# Patient Record
Sex: Male | Born: 1995 | Race: White | Hispanic: No | Marital: Single | Smoking: Never smoker
Health system: Southern US, Community
[De-identification: ages and names within clinical notes are randomized; demographics above are authoritative.]

## PROBLEM LIST (undated history)

## (undated) HISTORY — PX: HAND SURGERY: SHX662

---
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
## 2016-12-28 IMAGING — RF DG TIBIA/FIBULA 2V*R*
1 series · 9 of 9 positions shown · non-contrast
Comparison: 11/16/2014

CLINICAL DATA: ORIF of right tibial fracture.

EXAM:
RIGHT TIBIA AND FIBULA - 2 VIEW; DG C-ARM 61-120 MIN-NO REPORT

[Series 1: run · 9 of 9 slices shown]
[im 1/9]
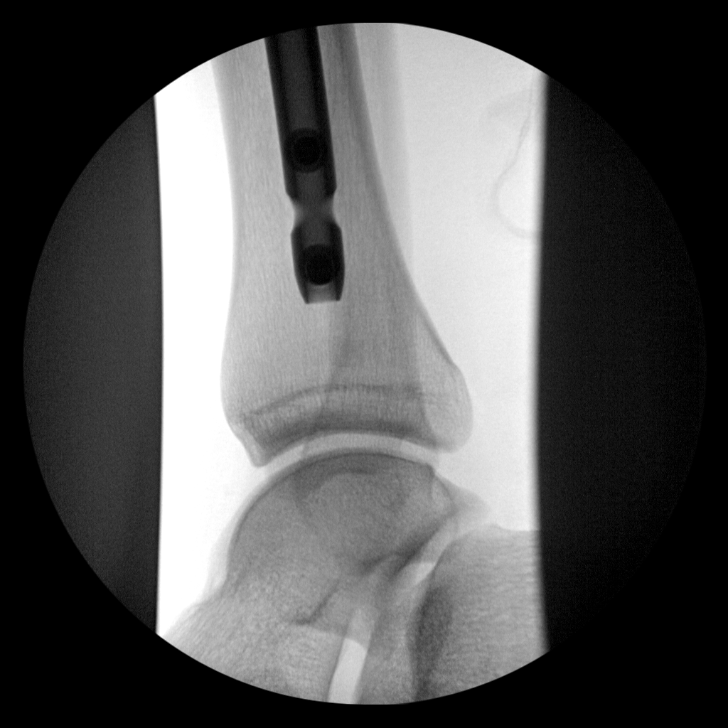
[im 2/9]
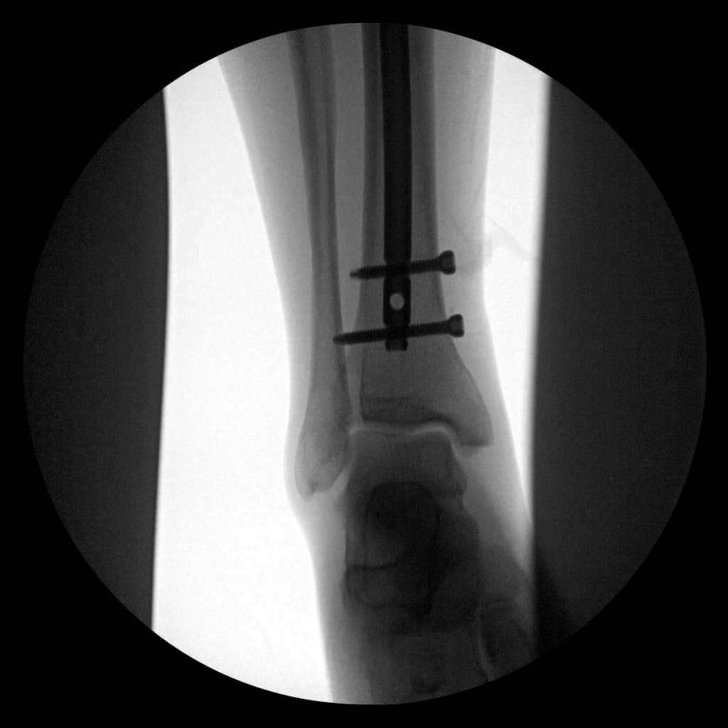
[im 3/9]
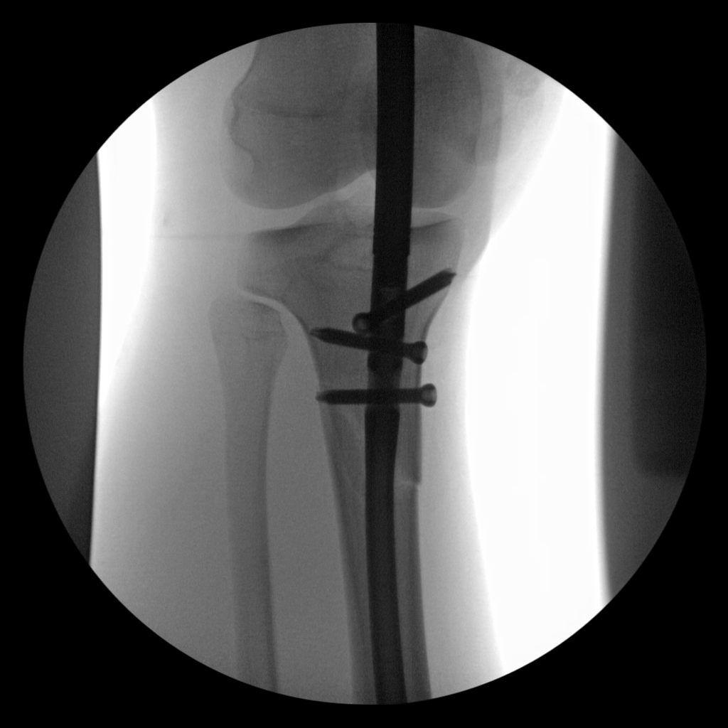
[im 4/9]
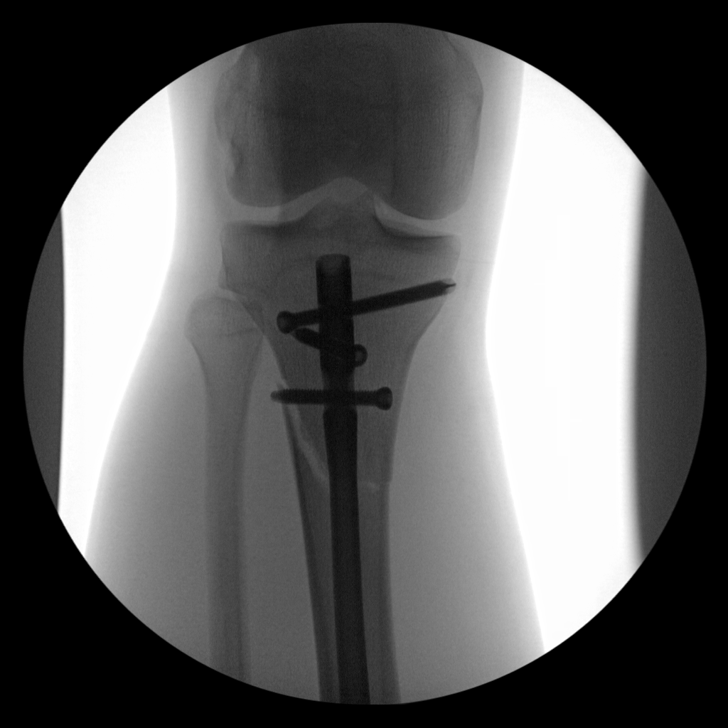
[im 5/9]
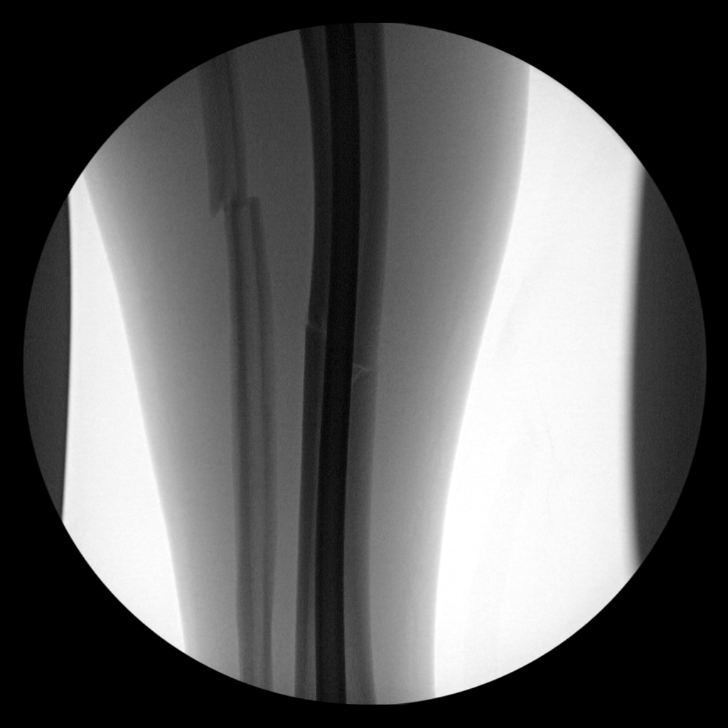
[im 6/9]
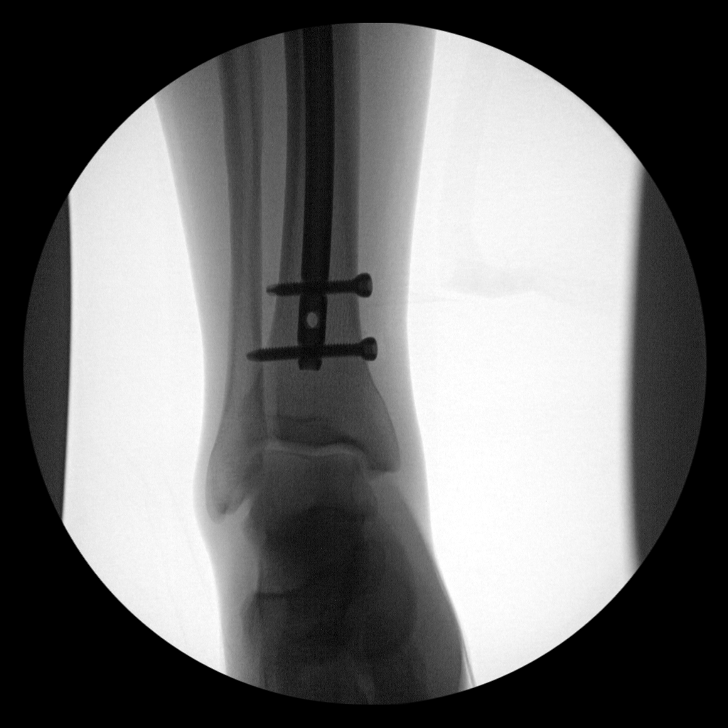
[im 7/9]
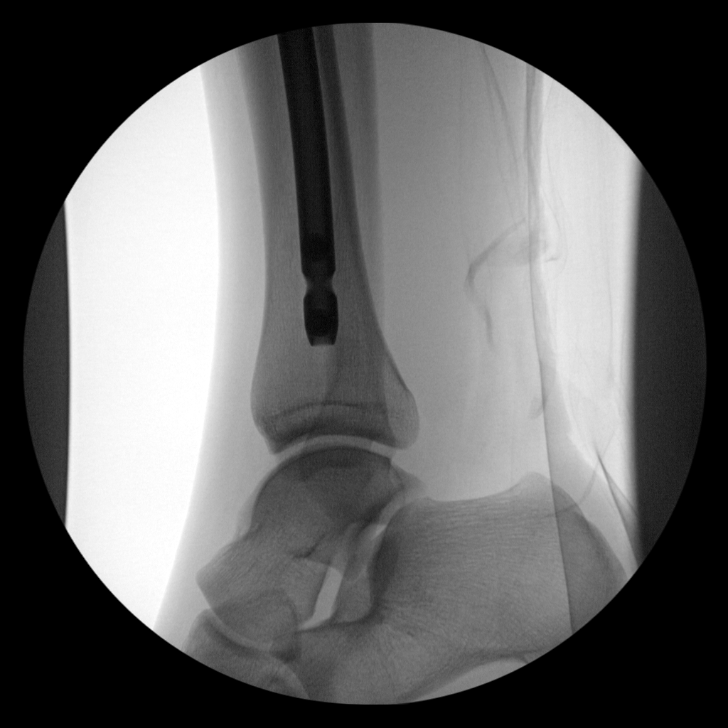
[im 8/9]
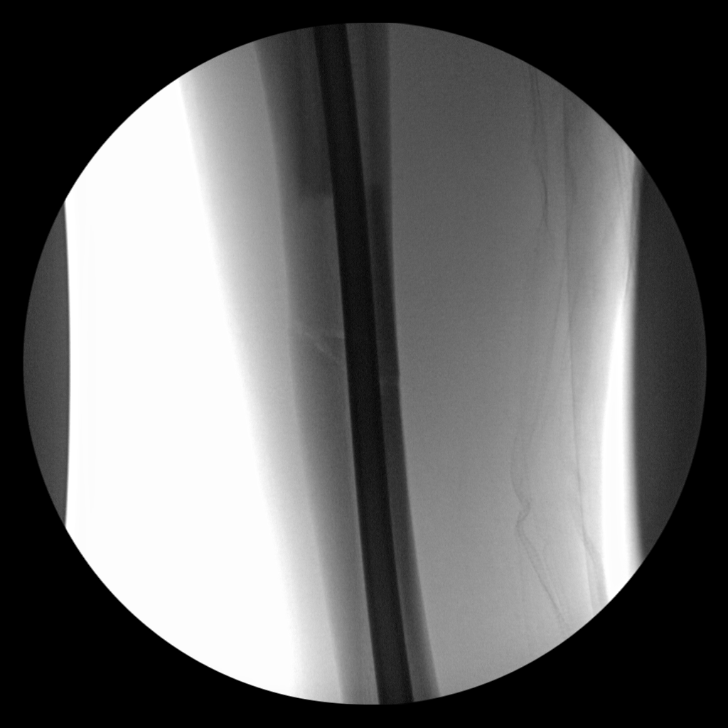
[im 9/9]
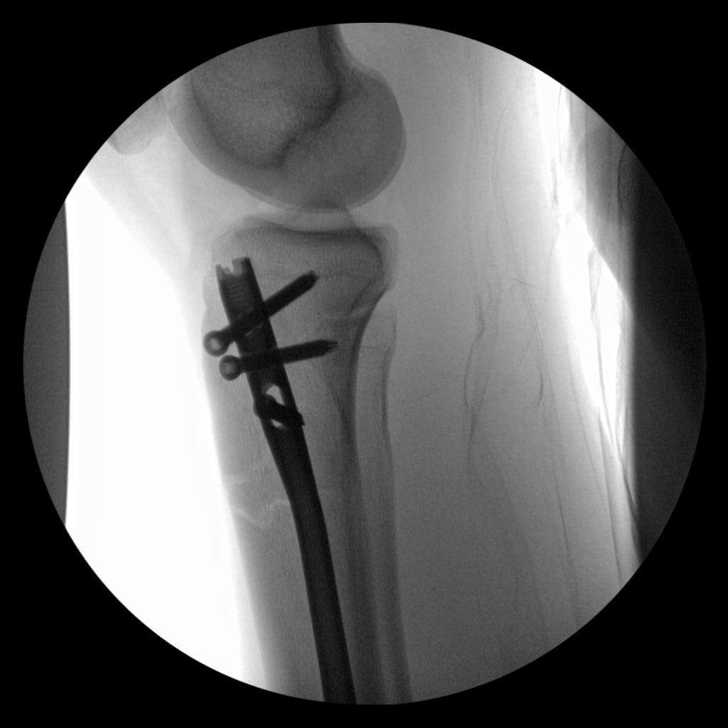

[9 of 9 positions shown; findings below may reference images not displayed]

FINDINGS: A C-arm was utilized intraoperatively during the orthopedic surgical
procedure. This demonstrates placement of an intramedullary nail
traversing the right tibial diaphyseal fracture. Alignment appears
near anatomic. There also is improved alignment at the level of the
mid fibular fracture.
IMPRESSION: Near anatomic alignment at the level of diaphyseal fracture after
placement of intra medullary nail all in the right tibia.

## 2017-10-15 ENCOUNTER — Other Ambulatory Visit: Payer: Self-pay

## 2017-10-15 ENCOUNTER — Encounter (HOSPITAL_BASED_OUTPATIENT_CLINIC_OR_DEPARTMENT_OTHER): Payer: Self-pay | Admitting: Emergency Medicine

## 2017-10-15 ENCOUNTER — Emergency Department (HOSPITAL_BASED_OUTPATIENT_CLINIC_OR_DEPARTMENT_OTHER)
Admission: EM | Admit: 2017-10-15 | Discharge: 2017-10-15 | Disposition: A | Discharge status: Home / Self Care | Payer: BLUE CROSS/BLUE SHIELD | Attending: Emergency Medicine | Admitting: Emergency Medicine

## 2017-10-15 DIAGNOSIS — M545 Low back pain, unspecified: Secondary | ICD-10-CM

## 2017-10-15 DIAGNOSIS — Z7982 Long term (current) use of aspirin: Secondary | ICD-10-CM | POA: Insufficient documentation

## 2017-10-15 DIAGNOSIS — Z79899 Other long term (current) drug therapy: Secondary | ICD-10-CM | POA: Insufficient documentation

## 2017-10-15 LAB — URINALYSIS, ROUTINE W REFLEX MICROSCOPIC
BILIRUBIN URINE: NEGATIVE
Glucose, UA: NEGATIVE mg/dL
Hgb urine dipstick: NEGATIVE
Ketones, ur: NEGATIVE mg/dL
Leukocytes, UA: NEGATIVE
NITRITE: NEGATIVE
PH: 6 (ref 5.0–8.0)
Protein, ur: NEGATIVE mg/dL

## 2017-10-15 MED ORDER — MELOXICAM 7.5 MG PO TABS: 15.0000 mg | ORAL_TABLET | Freq: Every day | ORAL | 0 refills | Status: AC

## 2017-10-15 NOTE — ED Provider Notes (Signed)
[facility] EMERGENCY DEPARTMENT Provider Note   CSN: [id number] Arrival date & time: 10/15/17  1904     History   Chief Complaint Chief Complaint  Patient presents with  . Back Pain    HPI Mark Wall is a 22 y.o. male.  22 year old male who presents with low back pain.  Patient states that he has had at least 4 months of low back pain that is across his lower back.  He has not noticed any specific activities that exacerbate his pain and nothing seems to make it better.  He has not tried any medications.  He does do a lot of heavy lifting for work.  He denies any specific trauma or injury prior to onset of pain.  He denies any numbness or weakness, saddle anesthesia, or bowel/bladder problems.  No fevers or recent illness.  No urinary symptoms.  He denies any drug use.  The history is provided by the patient.  Back Pain      History reviewed. No pertinent past medical history.  Patient Active Problem List   Diagnosis Date Noted  . Compartment syndrome of lower extremity, traumatic, right, initial encounter 11/19/2014  . Tibia fracture 11/17/2014  . Fracture of right tibia 11/17/2014    Past Surgical History:  Procedure Laterality Date  . DEBRIDEMENT AND CLOSURE WOUND N/A 11/22/2014   Procedure: DEBRIDEMENT AND PARTIAL CLOSURE WOUND RIGHT LOWER EXTREMITY,APPLICATION OF WOUND VAC;  Surgeon: [name], MD;  Location: [facility];  Service: Orthopedics;  Laterality: N/A;  . FASCIOTOMY Right 11/19/2014   Procedure: FASCIOTOMY, WOUND VAC PLACEMENT;  Surgeon: [name], MD;  Location: [facility];  Service: Orthopedics;  Laterality: Right;  . HAND SURGERY    . I&D EXTREMITY Right 11/24/2014   Procedure: DEBRIDEMENT LEG WOUND VAC CHANGE VS CLOSURE;  Surgeon: [name], MD;  Location: [facility] OR;  Service: Orthopedics;  Laterality: Right;  . TIBIA IM NAIL INSERTION Right 11/17/2014   Procedure: INTRAMEDULLARY (IM) NAIL TIBIAL;  Surgeon: [name], MD;  Location:  [facility];  Service: Orthopedics;  Laterality: Right;  . VEIN REPAIR Right 11/19/2014   Procedure: PROXIMAL RIGHT TIBIAL VEIN REPAIR;  Surgeon: Larina Earthly, MD;  Location: [facility];  Service: Vascular;  Laterality: Right;        Home Medications    Prior to Admission medications   Medication Sig Start Date End Date Taking? Authorizing Provider  aspirin EC 325 MG tablet Take 1 tablet (325 mg total) by mouth 2 (two) times daily after a meal. 11/19/14   Swinteck, Arlys John, MD  meloxicam (MOBIC) 7.5 MG tablet Take 2 tablets (15 mg total) by mouth daily. 10/15/17   Shekita Boyden, Ambrose Finland, MD  methocarbamol (ROBAXIN) 500 MG tablet Take 1 tablet (500 mg total) by mouth every 6 (six) hours as needed for muscle spasms. 11/25/14   Stilwell, Bryson L, PA-C  ondansetron (ZOFRAN) 4 MG tablet Take 1 tablet (4 mg total) by mouth every 8 (eight) hours as needed for nausea. 11/25/14   Stilwell, Herbie Baltimore, PA-C  oxyCODONE-acetaminophen (PERCOCET/ROXICET) 5-325 MG tablet Take 1-2 tablets by mouth every 4 (four) hours as needed for severe pain. 11/19/14   Swinteck, Arlys John, MD  senna (SENOKOT) 8.6 MG TABS tablet Take 2 tablets (17.2 mg total) by mouth daily as needed for mild constipation. 11/25/14   Stilwell, Bryson L, PA-C  traMADol (ULTRAM) 50 MG tablet Take 1 tablet (50 mg total) by mouth every 6 (six) hours as needed. 11/25/14   Arsenio Loader  L, PA-C    Family History History reviewed. No pertinent family history.  Social History Social History   Tobacco Use  . Smoking status: Never Smoker  Substance Use Topics  . Alcohol use: No  . Drug use: No     Allergies   Patient has no known allergies.   Review of Systems Review of Systems  Musculoskeletal: Positive for back pain.   All other systems reviewed and are negative except that which was mentioned in HPI   Physical Exam Updated Vital Signs BP (!) 117/57 (BP Location: Right Arm)   Pulse (!) 54   Temp 98 F (36.7 C) (Oral)   Resp 16   Ht 5'  10" (1.778 m)   Wt 74.8 kg   SpO2 99%   BMI 23.68 kg/m   Physical Exam  Constitutional: He is oriented to person, place, and time. He appears well-developed and well-nourished. No distress.  HENT:  Head: Normocephalic and atraumatic.  Moist mucous membranes  Eyes: Conjunctivae are normal.  Neck: Neck supple.  Cardiovascular: Normal rate, regular rhythm and normal heart sounds.  No murmur heard. Pulmonary/Chest: Effort normal and breath sounds normal.  Abdominal: Soft. Bowel sounds are normal. He exhibits no distension. There is no tenderness.  Musculoskeletal: He exhibits no edema.  Neurological: He is alert and oriented to person, place, and time. He displays normal reflexes. No sensory deficit. He exhibits normal muscle tone.  Fluent speech 5/5 strength BLE  Skin: Skin is warm and dry.  Psychiatric: He has a normal mood and affect. Judgment normal.  Nursing note and vitals reviewed.    ED Treatments / Results  Labs (all labs ordered are listed, but only abnormal results are displayed) Labs Reviewed  URINALYSIS, ROUTINE W REFLEX MICROSCOPIC - Abnormal; Notable for the following components:      Result Value   Specific Gravity, Urine <1.005 (*)    All other components within normal limits    EKG None  Radiology No results found.  Procedures Procedures (including critical care time)  Medications Ordered in ED Medications - No data to display   Initial Impression / Assessment and Plan / ED Course  I have reviewed the triage vital signs and the nursing notes.    Patient demonstrates no lower extremity weakness, saddle anesthesia, bowel or bladder incontinence, or any other neurologic deficits concerning for cauda equina. No fevers or other infectious symptoms to suggest by the patient's back pain is due to an infection. I have reviewed return precautions, including the development of any of these signs or symptoms, and the patient has voiced understanding. I  reviewed supportive care instructions, including NSAIDs, early range of motion exercises, and PCP follow-up if symptoms do not improve for referral to physical therapy. Patient voiced understanding and was discharged in satisfactory condition.  Final Clinical Impressions(s) / ED Diagnoses   Final diagnoses:  Bilateral low back pain without sciatica, unspecified chronicity    ED Discharge Orders         Ordered    meloxicam (MOBIC) 7.5 MG tablet  Daily     10/15/17 2249           Abdulahad Mederos, Ambrose Finland, MD 10/15/17 2349

## 2017-10-15 NOTE — ED Triage Notes (Signed)
Reports lower back pain and fatigue x 3 months.  Denies dysuria, hematuria.  Denies back injury.  Alert and oriented, ambulatory to triage in NAD.

## 2018-12-05 ENCOUNTER — Other Ambulatory Visit: Payer: Self-pay

## 2018-12-05 DIAGNOSIS — Z20822 Contact with and (suspected) exposure to covid-19: Secondary | ICD-10-CM

## 2018-12-07 LAB — NOVEL CORONAVIRUS, NAA: SARS-CoV-2, NAA: NOT DETECTED

## 2020-10-17 ENCOUNTER — Emergency Department (INDEPENDENT_AMBULATORY_CARE_PROVIDER_SITE_OTHER)
Admission: EM | Admit: 2020-10-17 | Discharge: 2020-10-17 | Disposition: A | Discharge status: Other Acute Inpatient Hospital | Payer: BC Managed Care – PPO | Source: Home / Self Care

## 2020-10-17 DIAGNOSIS — S0990XA Unspecified injury of head, initial encounter: Secondary | ICD-10-CM

## 2020-10-17 NOTE — ED Triage Notes (Signed)
Pt states he hit back of his head on metal equipment Tuesday. Now having pain behind right eye, headache and vomitting x2 yesterday/ here with mother.

## 2020-10-17 NOTE — ED Provider Notes (Signed)
[facility]    CSN: [id number] Arrival date & time: 10/17/20  2951      History   Chief Complaint Chief Complaint  Patient presents with   Headache    Pt states he got hit in the back of his head with a piece of metal equipment on Tuesday. States he feels like he "blacked out" for a minute. Started having vomiting x2  yesterday and pain behind right eye. Has history of multiple concussions. Feels like something is "draining" in the back of his head.     HPI Mark Wall is a 25 y.o. male.   HPI 25 year old male presents with head injury sustained 2 days ago while at work.  Patient reports piece of metal equipment hit him in the back of the head causing him to blackout for a minute he felt pain between behind his right eye and reports history of multiple concussions.  Patient reports headache and vomiting x2 yesterday.  Patient is accompanied by his Mother who drove him to clinic today.  No past medical history on file.  Patient Active Problem List   Diagnosis Date Noted   Compartment syndrome of lower extremity, traumatic, right, initial encounter 11/19/2014   Tibia fracture 11/17/2014   Fracture of right tibia 11/17/2014    Past Surgical History:  Procedure Laterality Date   DEBRIDEMENT AND CLOSURE WOUND N/A 11/22/2014   Procedure: DEBRIDEMENT AND PARTIAL CLOSURE WOUND RIGHT LOWER EXTREMITY,APPLICATION OF WOUND VAC;  Surgeon: [name], MD;  Location: [facility];  Service: Orthopedics;  Laterality: N/A;   FASCIOTOMY Right 11/19/2014   Procedure: FASCIOTOMY, WOUND VAC PLACEMENT;  Surgeon: [name], MD;  Location: [facility];  Service: Orthopedics;  Laterality: Right;   HAND SURGERY     I & D EXTREMITY Right 11/24/2014   Procedure: DEBRIDEMENT LEG WOUND VAC CHANGE VS CLOSURE;  Surgeon: [name], MD;  Location: [facility];  Service: Orthopedics;  Laterality: Right;   TIBIA IM NAIL INSERTION Right 11/17/2014   Procedure: INTRAMEDULLARY (IM) NAIL TIBIAL;   Surgeon: [name], MD;  Location: [facility];  Service: Orthopedics;  Laterality: Right;   VEIN REPAIR Right 11/19/2014   Procedure: PROXIMAL RIGHT TIBIAL VEIN REPAIR;  Surgeon: Larina Earthly, MD;  Location: [facility];  Service: Vascular;  Laterality: Right;       Home Medications    Prior to Admission medications   Medication Sig Start Date End Date Taking? Authorizing Provider  aspirin EC 325 MG tablet Take 1 tablet (325 mg total) by mouth 2 (two) times daily after a meal. 11/19/14   Swinteck, Arlys John, MD  meloxicam (MOBIC) 7.5 MG tablet Take 2 tablets (15 mg total) by mouth daily. 10/15/17   Little, Ambrose Finland, MD  methocarbamol (ROBAXIN) 500 MG tablet Take 1 tablet (500 mg total) by mouth every 6 (six) hours as needed for muscle spasms. 11/25/14   Stilwell, Bryson L, PA-C  ondansetron (ZOFRAN) 4 MG tablet Take 1 tablet (4 mg total) by mouth every 8 (eight) hours as needed for nausea. 11/25/14   Stilwell, Herbie Baltimore, PA-C  oxyCODONE-acetaminophen (PERCOCET/ROXICET) 5-325 MG tablet Take 1-2 tablets by mouth every 4 (four) hours as needed for severe pain. 11/19/14   Swinteck, Arlys John, MD  senna (SENOKOT) 8.6 MG TABS tablet Take 2 tablets (17.2 mg total) by mouth daily as needed for mild constipation. 11/25/14   Stilwell, Bryson L, PA-C  traMADol (ULTRAM) 50 MG tablet Take 1 tablet (50 mg total) by mouth every 6 (six) hours  as needed. 11/25/14   Stilwell, Herbie Baltimore, PA-C    Family History No family history on file.  Social History Social History   Tobacco Use   Smoking status: Never  Substance Use Topics   Alcohol use: No   Drug use: No     Allergies   Patient has no known allergies.   Review of Systems Review of Systems  Gastrointestinal:  Positive for vomiting.  Neurological:  Positive for dizziness and headaches.       Head injury 2 days ago    Physical Exam Triage Vital Signs ED Triage Vitals  Enc Vitals Group     BP      Pulse      Resp      Temp      Temp src       SpO2      Weight      Height      Head Circumference      Peak Flow      Pain Score      Pain Loc      Pain Edu?      Excl. in GC?    No data found.  Updated Vital Signs BP 130/76 (BP Location: Right Arm)   Pulse 60   Temp 98 F (36.7 C) (Oral)   SpO2 100%      Physical Exam Vitals and nursing note reviewed.  Constitutional:      General: He is not in acute distress.    Appearance: Normal appearance. He is normal weight. He is not ill-appearing.  HENT:     Head: Normocephalic and atraumatic.     Mouth/Throat:     Mouth: Mucous membranes are moist.     Pharynx: Oropharynx is clear.  Eyes:     Extraocular Movements: Extraocular movements intact.     Conjunctiva/sclera: Conjunctivae normal.     Pupils: Pupils are equal, round, and reactive to light.  Cardiovascular:     Rate and Rhythm: Normal rate and regular rhythm.     Pulses: Normal pulses.     Heart sounds: Normal heart sounds.  Pulmonary:     Effort: Pulmonary effort is normal. No respiratory distress.     Breath sounds: Normal breath sounds. No stridor. No wheezing, rhonchi or rales.  Musculoskeletal:        General: Normal range of motion.     Cervical back: Normal range of motion and neck supple. No rigidity.  Skin:    General: Skin is warm and dry.  Neurological:     General: No focal deficit present.     Mental Status: He is alert and oriented to person, place, and time. Mental status is at baseline.     Cranial Nerves: No cranial nerve deficit.     Sensory: No sensory deficit.     Motor: No weakness.     Coordination: Coordination normal.     Gait: Gait normal.     Deep Tendon Reflexes: Reflexes normal.  Psychiatric:        Mood and Affect: Mood normal.        Behavior: Behavior normal.     UC Treatments / Results  Labs (all labs ordered are listed, but only abnormal results are displayed) Labs Reviewed - No data to display  EKG   Radiology No results found.  Procedures Procedures  (including critical care time)  Medications Ordered in UC Medications - No data to display  Initial Impression / Assessment and Plan /  UC Course  I have reviewed the triage vital signs and the nursing notes.  Pertinent labs & imaging results that were available during my care of the patient were reviewed by me and considered in my medical decision making (see chart for details).     MDM: 1.  Injury of head, initial encounter-Advised/instructed patient to go to [facility] now for immediate evaluation of head injury and possible advanced imaging of head. Final Clinical Impressions(s) / UC Diagnoses   Final diagnoses:  Injury of head, initial encounter     Discharge Instructions      Advised/instructed patient to go to [facility] now for immediate evaluation of head injury and possible advanced imaging of head.     ED Prescriptions   None    PDMP not reviewed this encounter.   Trevor Iha, FNP 10/17/20 [phone]

## 2020-10-17 NOTE — Discharge Instructions (Addendum)
Advised/instructed patient to go to [facility] now for immediate evaluation of head injury and possible advanced imaging of head.
